# Patient Record
Sex: Male | Born: 1955 | Race: White | Hispanic: No | Marital: Single | Smoking: Never smoker
Health system: Southern US, Community
[De-identification: ages and names within clinical notes are randomized; demographics above are authoritative.]

## PROBLEM LIST (undated history)

## (undated) DIAGNOSIS — C439 Malignant melanoma of skin, unspecified: Secondary | ICD-10-CM

## (undated) HISTORY — DX: Malignant melanoma of skin, unspecified: C43.9

## (undated) HISTORY — PX: KNEE ARTHROSCOPY: SUR90

---

## 2004-10-11 ENCOUNTER — Encounter: Admission: RE | Admit: 2004-10-11 | Discharge: 2004-10-11 | Payer: Self-pay | Admitting: Sports Medicine

## 2005-04-24 ENCOUNTER — Ambulatory Visit (HOSPITAL_COMMUNITY): Admission: RE | Admit: 2005-04-24 | Discharge: 2005-04-24 | Payer: Self-pay | Admitting: Cardiology

## 2019-06-21 ENCOUNTER — Other Ambulatory Visit: Payer: Self-pay

## 2019-06-21 DIAGNOSIS — Z20822 Contact with and (suspected) exposure to covid-19: Secondary | ICD-10-CM

## 2019-06-23 LAB — NOVEL CORONAVIRUS, NAA: SARS-CoV-2, NAA: NOT DETECTED

## 2019-06-25 ENCOUNTER — Other Ambulatory Visit: Payer: Self-pay

## 2019-06-25 ENCOUNTER — Emergency Department (HOSPITAL_COMMUNITY): Payer: BC Managed Care – PPO

## 2019-06-25 ENCOUNTER — Encounter (HOSPITAL_COMMUNITY): Payer: Self-pay

## 2019-06-25 ENCOUNTER — Emergency Department (HOSPITAL_COMMUNITY)
Admission: EM | Admit: 2019-06-25 | Discharge: 2019-06-25 | Disposition: A | Discharge status: Home / Self Care | Payer: BC Managed Care – PPO | Attending: Emergency Medicine | Admitting: Emergency Medicine

## 2019-06-25 DIAGNOSIS — J069 Acute upper respiratory infection, unspecified: Secondary | ICD-10-CM | POA: Insufficient documentation

## 2019-06-25 DIAGNOSIS — R05 Cough: Secondary | ICD-10-CM | POA: Diagnosis present

## 2019-06-25 MED ORDER — DOXYCYCLINE HYCLATE 100 MG PO CAPS: 100.0000 mg | ORAL_CAPSULE | Freq: Two times a day (BID) | ORAL | 0 refills | Status: DC

## 2019-06-25 MED ORDER — BENZONATATE 100 MG PO CAPS: 100.0000 mg | ORAL_CAPSULE | Freq: Three times a day (TID) | ORAL | 0 refills | Status: DC

## 2019-06-25 NOTE — Discharge Instructions (Signed)
Take doxycycline as prescribed.  Take Tessalon as needed for cough.  Rest and drink plenty of fluids.  Take Tylenol or Motrin as needed for pain.  Follow-up with your primary care provider in a week for continued evaluation.  Return to the ER immediately for new or worsening symptoms or concerns, such as chest pain, shortness of breath or any concerns at all.

## 2019-06-25 NOTE — ED Triage Notes (Addendum)
Pt states he has had a dry cough x 1 week. PCP suggested chest x ray for eval. No relief with tessalon pearls.

## 2019-06-25 NOTE — ED Provider Notes (Signed)
[facility] DEPT Provider Note   CSN: [id number] Arrival date & time: 06/25/19  [id number]     History   Chief Complaint Chief Complaint  Patient presents with  . Cough    HPI Shawn Fuller is a 63 y.o. male.     HPI  63 year old male, no significant medical history, presents with a 10-day history of cough.  He states that he has been tested for coronavirus on Tuesday and it was negative.  He notes that he coughs up some mucus.  He denies any fevers, chills, chest pain, shortness of breath.  He denies any loss of taste or loss of smell.  Denies any known sick contacts.  History reviewed. No pertinent past medical history.  There are no active problems to display for this patient.   History reviewed. No pertinent surgical history.      Home Medications    Prior to Admission medications   Not on File    Family History No family history on file.  Social History Social History   Tobacco Use  . Smoking status: Not on file  Substance Use Topics  . Alcohol use: Not on file  . Drug use: Not on file     Allergies   Patient has no known allergies.   Review of Systems Review of Systems  Constitutional: Negative for chills and fever.  HENT: Negative for ear discharge, ear pain, postnasal drip and rhinorrhea.   Respiratory: Positive for cough. Negative for shortness of breath.   Cardiovascular: Negative for chest pain.  Gastrointestinal: Negative for abdominal pain, nausea and vomiting.     Physical Exam Updated Vital Signs BP (!) 154/99   Pulse 83   Temp 98.2 F (36.8 C) (Oral)   Resp 16   Wt 103.9 kg   SpO2 98%   Physical Exam Vitals signs and nursing note reviewed.  Constitutional:      Appearance: He is well-developed.  HENT:     Head: Normocephalic and atraumatic.     Nose: Nose normal.     Mouth/Throat:     Lips: Pink.     Tongue: Tongue does not deviate from midline.     Palate: No lesions.     Pharynx:  Uvula midline. Posterior oropharyngeal erythema present.     Tonsils: No tonsillar exudate or tonsillar abscesses.  Eyes:     Conjunctiva/sclera: Conjunctivae normal.  Neck:     Musculoskeletal: Neck supple.  Cardiovascular:     Rate and Rhythm: Normal rate and regular rhythm.     Heart sounds: Normal heart sounds. No murmur.  Pulmonary:     Effort: Pulmonary effort is normal. No respiratory distress.     Breath sounds: Normal breath sounds. No wheezing or rales.  Abdominal:     General: Bowel sounds are normal. There is no distension.     Palpations: Abdomen is soft.     Tenderness: There is no abdominal tenderness.  Musculoskeletal: Normal range of motion.        General: No tenderness or deformity.  Skin:    General: Skin is warm and dry.     Findings: No erythema or rash.  Neurological:     Mental Status: He is alert and oriented to person, place, and time.  Psychiatric:        Behavior: Behavior normal.      ED Treatments / Results  Labs (all labs ordered are listed, but only abnormal results are displayed) Labs Reviewed - No data  to display  EKG None  Radiology Dg Chest 2 View  Result Date: 06/25/2019 CLINICAL DATA:  Dry cough for a week. EXAM: CHEST - 2 VIEW COMPARISON:  None. FINDINGS: The heart size and mediastinal contours are within normal limits. Both lungs are clear. The visualized skeletal structures are unremarkable. IMPRESSION: No active cardiopulmonary disease.  No evidence of pneumonia. Electronically Signed   By: Franki Cabot M.D.   On: 06/25/2019 09:57    Procedures Procedures (including critical care time)  Medications Ordered in ED Medications - No data to display   Initial Impression / Assessment and Plan / ED Course  I have reviewed the triage vital signs and the nursing notes.  Pertinent labs & imaging results that were available during my care of the patient were reviewed by me and considered in my medical decision making (see chart for  details).        Patient presents with a cough for 10 days.  His physical exam shows some mildly erythematous posterior pharynx.  His lungs are clear to auscultation throughout.  He has no increased work of breathing or accessory muscle use.  He had a chest x-ray done by triage which shows no abnormalities, no pneumonia, pneumothorax, pleural effusion.  He was coronavirus tested on Tuesday and this was negative.  He is well-appearing however given his symptom duration, will trial antibiotics.  We will also write for cough medicine.  Patient is agreeable with this plan.  He was given strict return precautions and expressed understanding.  He is ready and stable for discharge.   At this time there does not appear to be any evidence of an acute emergency medical condition and the patient appears stable for discharge with appropriate outpatient follow up.Diagnosis was discussed with patient who verbalizes understanding and is agreeable to discharge.   Final Clinical Impressions(s) / ED Diagnoses   Final diagnoses:  None    ED Discharge Orders    None       Etter Sjogren, PA-C 06/25/19 [place], [place], DO 06/25/19 1851

## 2019-08-18 ENCOUNTER — Other Ambulatory Visit: Payer: Self-pay

## 2019-08-18 ENCOUNTER — Ambulatory Visit: Payer: BC Managed Care – PPO | Admitting: Pulmonary Disease

## 2019-08-18 ENCOUNTER — Encounter: Payer: Self-pay | Admitting: Pulmonary Disease

## 2019-08-18 VITALS — BP 140/88 | HR 70 | Temp 97.3°F | Ht 72.0 in | Wt 226.2 lb

## 2019-08-18 DIAGNOSIS — R058 Other specified cough: Secondary | ICD-10-CM

## 2019-08-18 DIAGNOSIS — R05 Cough: Secondary | ICD-10-CM | POA: Diagnosis not present

## 2019-08-18 DIAGNOSIS — R059 Cough, unspecified: Secondary | ICD-10-CM

## 2019-08-18 NOTE — Patient Instructions (Addendum)
Thank you for visiting Dr. Valeta Harms at [facility]. Today we recommend the following:  Trial of steroid inhaler. Twice daily.   Return if symptoms worsen or fail to improve.    Please do your part to reduce the spread of COVID-19.

## 2019-08-18 NOTE — Progress Notes (Signed)
Synopsis: Referred in November 2020 for chronic cough by Gaynelle Arabian, MD  Subjective:   PATIENT ID: Shawn Fuller GENDER: male DOB: 1955/10/07, MRN: [id number]  Chief Complaint  Patient presents with  . Pulmonary Consult    cough since september    NO significant medical history. 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 He was living in an apartment in as they were renovating his home. Not involved in the renovation. Just walk through to check on stuff. No dust exposure. No animals in the home. Cough start spontaneously in September. The cough is worse going to bed and laying down. When the cough first started he had to sleep in the other room.  He cannot think of any events that occurred in September that may have caused the cough.  Does not have any hobbies that are exposures to chemicals or dust.  He has a house cleaner and a line crew that helps with the outside.  He goes to a Physiological scientist 3 times a week at his home.  He did have Covid testing that was negative.  He lives with his wife in [facility].  He was put on steroids and antibiotics which did not improve his cough.  He was also given a PPI which also did not improve his cough.  However he has only been that for 7 to 10 days.  Patient denies fevers chills night sweats weight loss.   Past Medical History:  Diagnosis Date  . Melanoma ([place])      Family History  Problem Relation Age of Onset  . Lung cancer Mother   . Heart disease Father      Past Surgical History:  Procedure Laterality Date  . KNEE ARTHROSCOPY      Social History   Socioeconomic History  . Marital status: Single    Spouse name: Not on file  . Number of children: Not on file  . Years of education: Not on file  . Highest education level: Not on file  Occupational History  . Not on file  Social Needs  . Financial resource strain: Not on file  . Food insecurity    Worry: Not on file    Inability: Not on file  . Transportation needs    Medical: Not on file    Non-medical: Not on file  Tobacco Use  . Smoking status: Never Smoker  . Smokeless tobacco: Never Used  Substance and Sexual Activity  . Alcohol use: Not on file  . Drug use: Not on file  . Sexual activity: Not on file  Lifestyle  . Physical activity    Days per week: Not on file    Minutes per session: Not on file  . Stress: Not on file  Relationships  . Social Herbalist on phone: Not on file    Gets together: Not on file    Attends religious service: Not on file    Active member of club or organization: Not on file    Attends meetings of clubs or organizations: Not on file    Relationship status: Not on file  . Intimate partner violence    Fear of current or ex partner: Not on file    Emotionally abused: Not on file    Physically abused: Not on file    Forced sexual activity: Not on file  Other Topics Concern  . Not on file  Social History Narrative  . Not on file     No  Known Allergies   Outpatient Medications Prior to Visit  Medication Sig Dispense Refill  . benzonatate (TESSALON) 100 MG capsule Take 1 capsule (100 mg total) by mouth every 8 (eight) hours. (Patient not taking: Reported on 08/18/2019) 21 capsule 0  . doxycycline (VIBRAMYCIN) 100 MG capsule Take 1 capsule (100 mg total) by mouth 2 (two) times daily. (Patient not taking: Reported on 08/18/2019) 14 capsule 0   No facility-administered medications prior to visit.     Review of Systems  Constitutional: Negative for chills, fever, malaise/fatigue and weight loss.  HENT: Negative for hearing loss, sore throat and tinnitus.   Eyes: Negative for blurred vision and double vision.  Respiratory: Positive for cough. Negative for hemoptysis, sputum production, shortness of breath, wheezing and stridor.   Cardiovascular: Negative for chest pain, palpitations, orthopnea, leg swelling and PND.  Gastrointestinal: Negative for abdominal pain, constipation, diarrhea, heartburn,  nausea and vomiting.  Genitourinary: Negative for dysuria, hematuria and urgency.  Musculoskeletal: Negative for joint pain and myalgias.  Skin: Negative for itching and rash.  Neurological: Negative for dizziness, tingling, weakness and headaches.  Endo/Heme/Allergies: Negative for environmental allergies. Does not bruise/bleed easily.  Psychiatric/Behavioral: Negative for depression. The patient is not nervous/anxious and does not have insomnia.   All other systems reviewed and are negative.    Objective:  Physical Exam Vitals signs reviewed.  Constitutional:      General: He is not in acute distress.    Appearance: He is well-developed.  HENT:     Head: Normocephalic and atraumatic.  Eyes:     General: No scleral icterus.    Conjunctiva/sclera: Conjunctivae normal.     Pupils: Pupils are equal, round, and reactive to light.  Neck:     Musculoskeletal: Neck supple.     Vascular: No JVD.     Trachea: No tracheal deviation.  Cardiovascular:     Rate and Rhythm: Normal rate and regular rhythm.     Heart sounds: Normal heart sounds. No murmur.  Pulmonary:     Effort: Pulmonary effort is normal. No tachypnea, accessory muscle usage or respiratory distress.     Breath sounds: Normal breath sounds. No stridor. No wheezing, rhonchi or rales.  Abdominal:     General: Bowel sounds are normal. There is no distension.     Palpations: Abdomen is soft.     Tenderness: There is no abdominal tenderness.  Musculoskeletal:        General: No tenderness.  Lymphadenopathy:     Cervical: No cervical adenopathy.  Skin:    General: Skin is warm and dry.     Capillary Refill: Capillary refill takes less than 2 seconds.     Findings: No rash.  Neurological:     Mental Status: He is alert and oriented to person, place, and time.  Psychiatric:        Behavior: Behavior normal.      Vitals:   08/18/19 0923  BP: 140/88  Pulse: 70  Temp: (!) 97.3 F (36.3 C)  TempSrc: Oral  SpO2: 98%   Weight: 226 lb 3.2 oz (102.6 kg)  Height: 6' (1.829 m)   98% on RA BMI Readings from Last 3 Encounters:  08/18/19 30.68 kg/m   Wt Readings from Last 3 Encounters:  08/18/19 226 lb 3.2 oz (102.6 kg)  06/25/19 229 lb (103.9 kg)     CBC No results found for: WBC, RBC, HGB, HCT, PLT, MCV, MCH, MCHC, RDW, LYMPHSABS, MONOABS, EOSABS, BASOSABS  06/21/2019: COVID-19 negative  Chest Imaging: 06/25/2019: Chest x-ray No active cardiopulmonary disease no infiltrate. The patient's images have been independently reviewed by me.    Pulmonary Functions Testing Results: No flowsheet data found.  FeNO: None   Pathology: None   Echocardiogram: None   Heart Catheterization: None     Assessment & Plan:     ICD-10-CM   1. Cough  R05   2. Non-productive cough  R05     Discussion:  This is a 63 year old gentleman with cough present since the end of September.  It is approximately 7 weeks since the initial start date.  Denies hemoptysis.  No clear etiology of his cough.  I suspect that he had some type of URI/viral bronchial epithelial injury that has been slow to recover with post viral cough syndrome.  He has no symptoms consistent with upper airway cough syndrome.  He has used PPI with no improvement and also does not experience any reflux type symptoms.  Plan: His cough is slowly improving at this time.  He did keep this appointment just because the cough was so bad a couple weeks ago. We will give the patient a steroid inhaler, Qvar 40 to be used 1 puff twice daily. We will see if this helps some of the inflammation and symptoms over the next couple of days to weeks. I suspect his cough will dissipate over the next 2 weeks or so. We will give him samples of Delsym cough suppressant as well. This could very well be the start of recurrent symptoms potentially related to allergies but he has not had this trouble in the past.  I think the best thing to do is to treat him conservatively and  watch his trajectory of improvement that he has had over the past week.  Patient return to clinic as needed.  Greater than 50% of this patient's 45-minute office visit was been face-to-face discussing above recommendations and treatment plan.   Current Outpatient Medications:  .  benzonatate (TESSALON) 100 MG capsule, Take 1 capsule (100 mg total) by mouth every 8 (eight) hours. (Patient not taking: Reported on 08/18/2019), Disp: 21 capsule, Rfl: 0 .  doxycycline (VIBRAMYCIN) 100 MG capsule, Take 1 capsule (100 mg total) by mouth 2 (two) times daily. (Patient not taking: Reported on 08/18/2019), Disp: 14 capsule, Rfl: 0   Garner Nash, DO [facility] 08/18/2019 9:50 AM

## 2020-01-09 DIAGNOSIS — C44111 Basal cell carcinoma of skin of unspecified eyelid, including canthus: Secondary | ICD-10-CM | POA: Insufficient documentation

## 2021-01-21 ENCOUNTER — Ambulatory Visit: Payer: BC Managed Care – PPO | Admitting: Family Medicine

## 2021-01-29 ENCOUNTER — Encounter: Payer: Self-pay | Admitting: Family Medicine

## 2021-01-29 ENCOUNTER — Other Ambulatory Visit: Payer: Self-pay

## 2021-01-29 ENCOUNTER — Ambulatory Visit: Payer: BC Managed Care – PPO | Admitting: Family Medicine

## 2021-01-29 VITALS — BP 142/64 | HR 77 | Ht 71.0 in | Wt 202.8 lb

## 2021-01-29 DIAGNOSIS — G9332 Myalgic encephalomyelitis/chronic fatigue syndrome: Secondary | ICD-10-CM | POA: Insufficient documentation

## 2021-01-29 DIAGNOSIS — R5382 Chronic fatigue, unspecified: Secondary | ICD-10-CM | POA: Insufficient documentation

## 2021-01-29 DIAGNOSIS — Z860100 Personal history of colon polyps, unspecified: Secondary | ICD-10-CM

## 2021-01-29 DIAGNOSIS — E559 Vitamin D deficiency, unspecified: Secondary | ICD-10-CM | POA: Insufficient documentation

## 2021-01-29 DIAGNOSIS — Z7689 Persons encountering health services in other specified circumstances: Secondary | ICD-10-CM | POA: Diagnosis not present

## 2021-01-29 DIAGNOSIS — E291 Testicular hypofunction: Secondary | ICD-10-CM | POA: Insufficient documentation

## 2021-01-29 DIAGNOSIS — C439 Malignant melanoma of skin, unspecified: Secondary | ICD-10-CM | POA: Diagnosis not present

## 2021-01-29 DIAGNOSIS — E663 Overweight: Secondary | ICD-10-CM | POA: Insufficient documentation

## 2021-01-29 NOTE — Progress Notes (Signed)
Office Visit Note   Patient: Shawn Fuller           Date of Birth: Feb 09, 1956           MRN: [id number] Visit Date: 01/29/2021 Requested by: Gaynelle Arabian, MD [address] [place],  [place] [address] PCP: Eunice Blase, MD  Subjective: Chief Complaint  Patient presents with  . Other    Establish primary care    HPI: He is here to establish care.  He was referred by Orinda Kenner.  Today he is doing well, no specific complaints.  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  He is monitored every 6 months by Dr. Elvera Lennox.  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  So far it seems to be helping a lot with his fatigue.  He exercises regularly, eats healthfully.  He has a history of vitamin D deficiency which has been well controlled with supplements.  He had colon polyps 2 years ago and is due for another colonoscopy in 3 years.  He is up-to-date on eye exams and dental exams.               ROS:   All other systems were reviewed and are negative.  Objective: Vital Signs: BP (!) 142/64   Pulse 77   Ht 5\' 11"  (1.803 m)   Wt 202 lb 12.8 oz (92 kg)   BMI 28.28 kg/m   Physical Exam:  General:  Alert and oriented, in no acute distress. Pulm:  Breathing unlabored. Psy:  Normal mood, congruent affect. Skin: No suspicious lesions today. Neck: No carotid bruits.  No lymphadenopathy, no thyromegaly. CV: Regular rate and rhythm without murmurs, rubs, or gallops.  No peripheral edema.  2+ radial and posterior tibial pulses. Lungs: Clear to auscultation throughout with no wheezing or areas of consolidation. Abd: Bowel sounds are active, no hepatosplenomegaly or masses.  Soft and nontender.  No audible bruits.  No evidence of ascites.    Imaging: No results found.  Assessment & Plan: 1.  Visit to establish  care -He will be due for a wellness exam in December so he will come back for that when the time comes. -He will contact me sooner than that if any issues occur.  2.  History of multiple skin cancers, monitored by dermatology.  3.  Testosterone deficiency, doing well with pellets.  4.  History of vitamin D deficiency  5.  History of colon polyps - Colonoscopy in 2025.     Procedures: No procedures performed        PMFS History: Patient Active Problem List   Diagnosis Date Noted  . 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 Chronic fatigue syndrome 01/29/2021  . Overweight with body mass index (BMI) 25.0-29.9 01/29/2021  . Melanoma ([place]) 01/29/2021  . 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 Melanoma ([place])     Family History  Problem Relation Age of Onset  . Lung cancer Mother   . Cancer Mother   . Heart disease Father   . Diabetes Father   . Prostate cancer Neg Hx   . Colon cancer Neg Hx   . Skin cancer Neg Hx     Past Surgical History:  Procedure Laterality Date  . KNEE ARTHROSCOPY     Social  History   Occupational History  . Not on file  Tobacco Use  . Smoking status: Never Smoker  . Smokeless tobacco: Never Used  Substance and Sexual Activity  . Alcohol use: Not on file  . Drug use: Not on file  . Sexual activity: Not on file

## 2021-03-21 IMAGING — CR DG CHEST 2V
2 series · 2 of 2 positions shown · non-contrast
Comparison: None.

CLINICAL DATA: Dry cough for a week.

EXAM:
CHEST - 2 VIEW

[w chest pa]
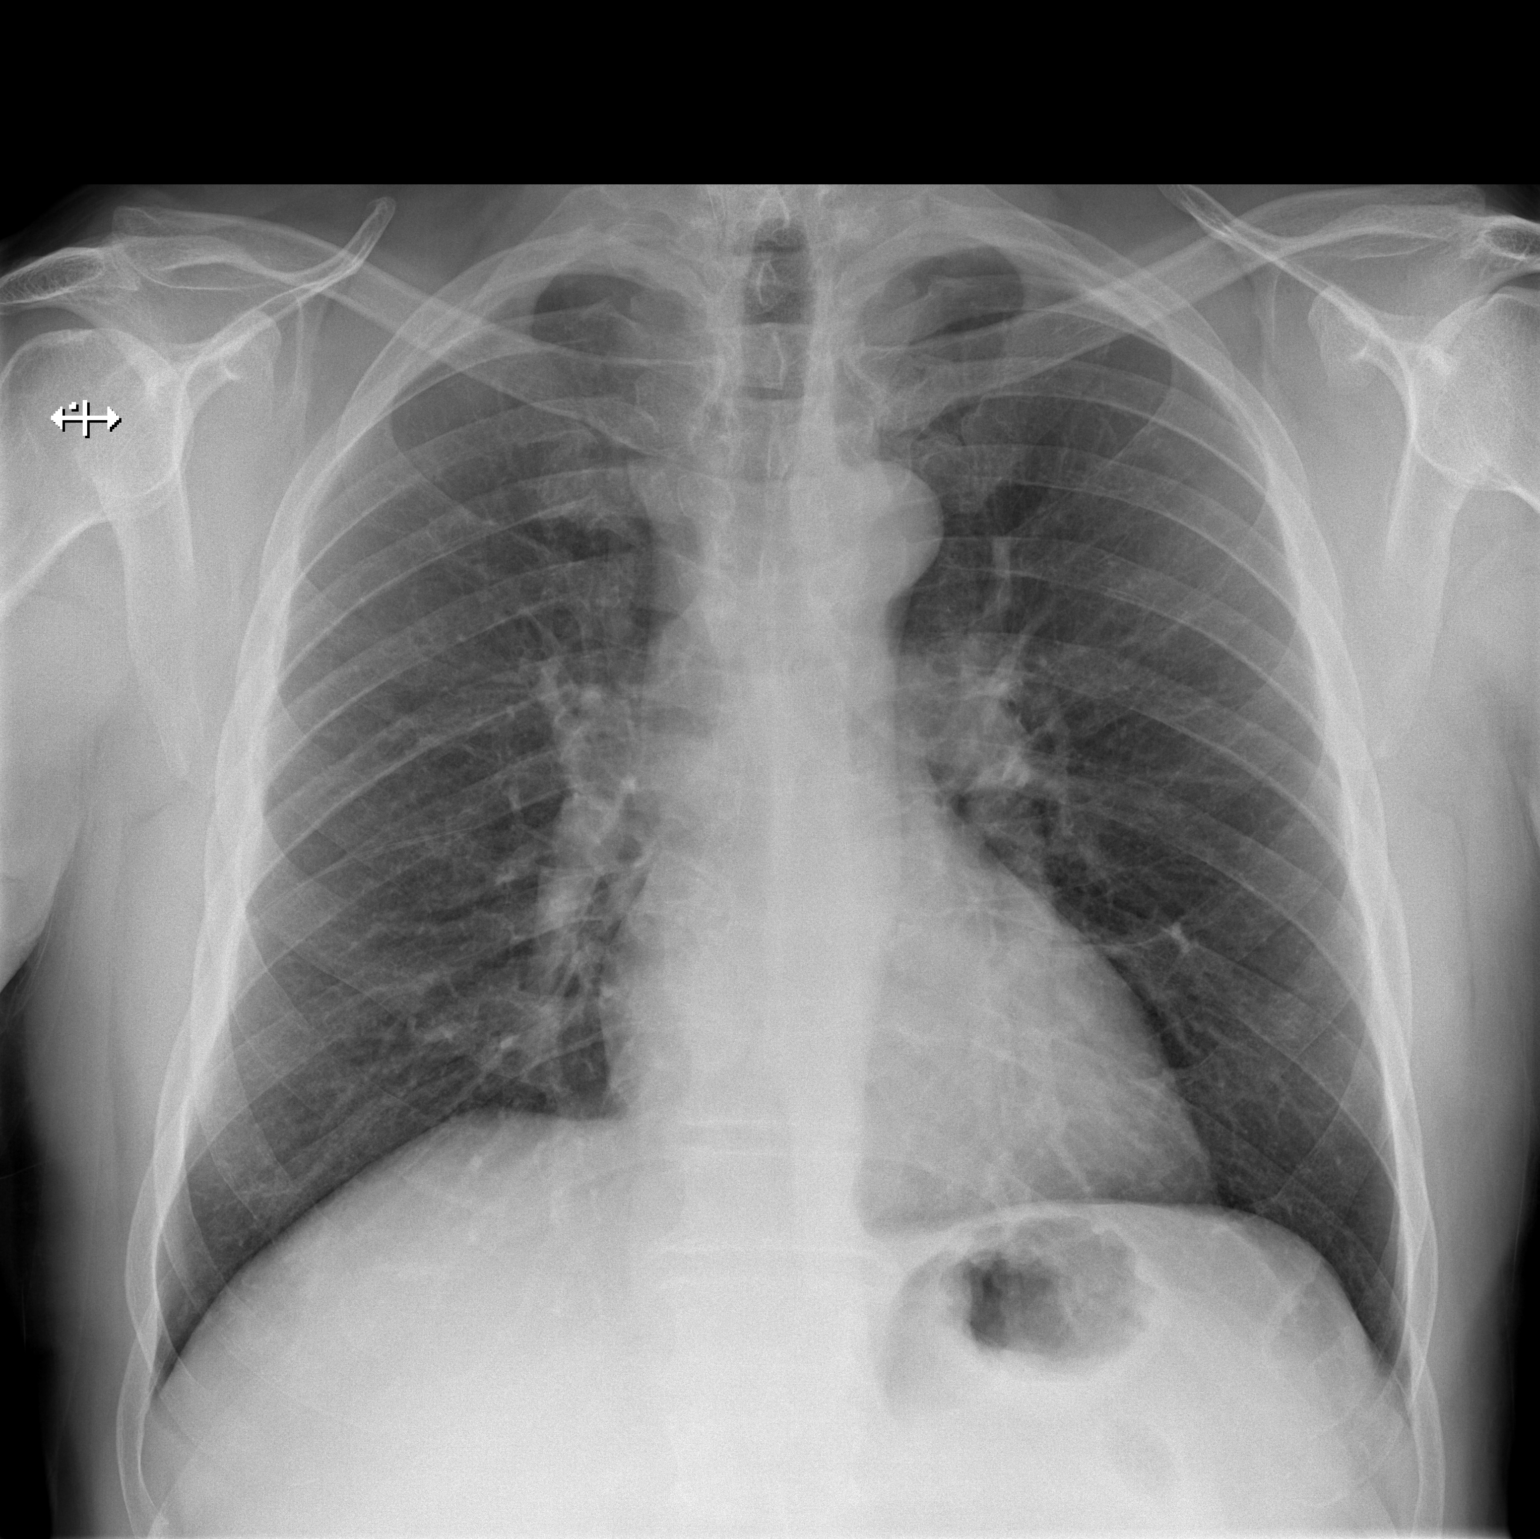

[w chest lat]
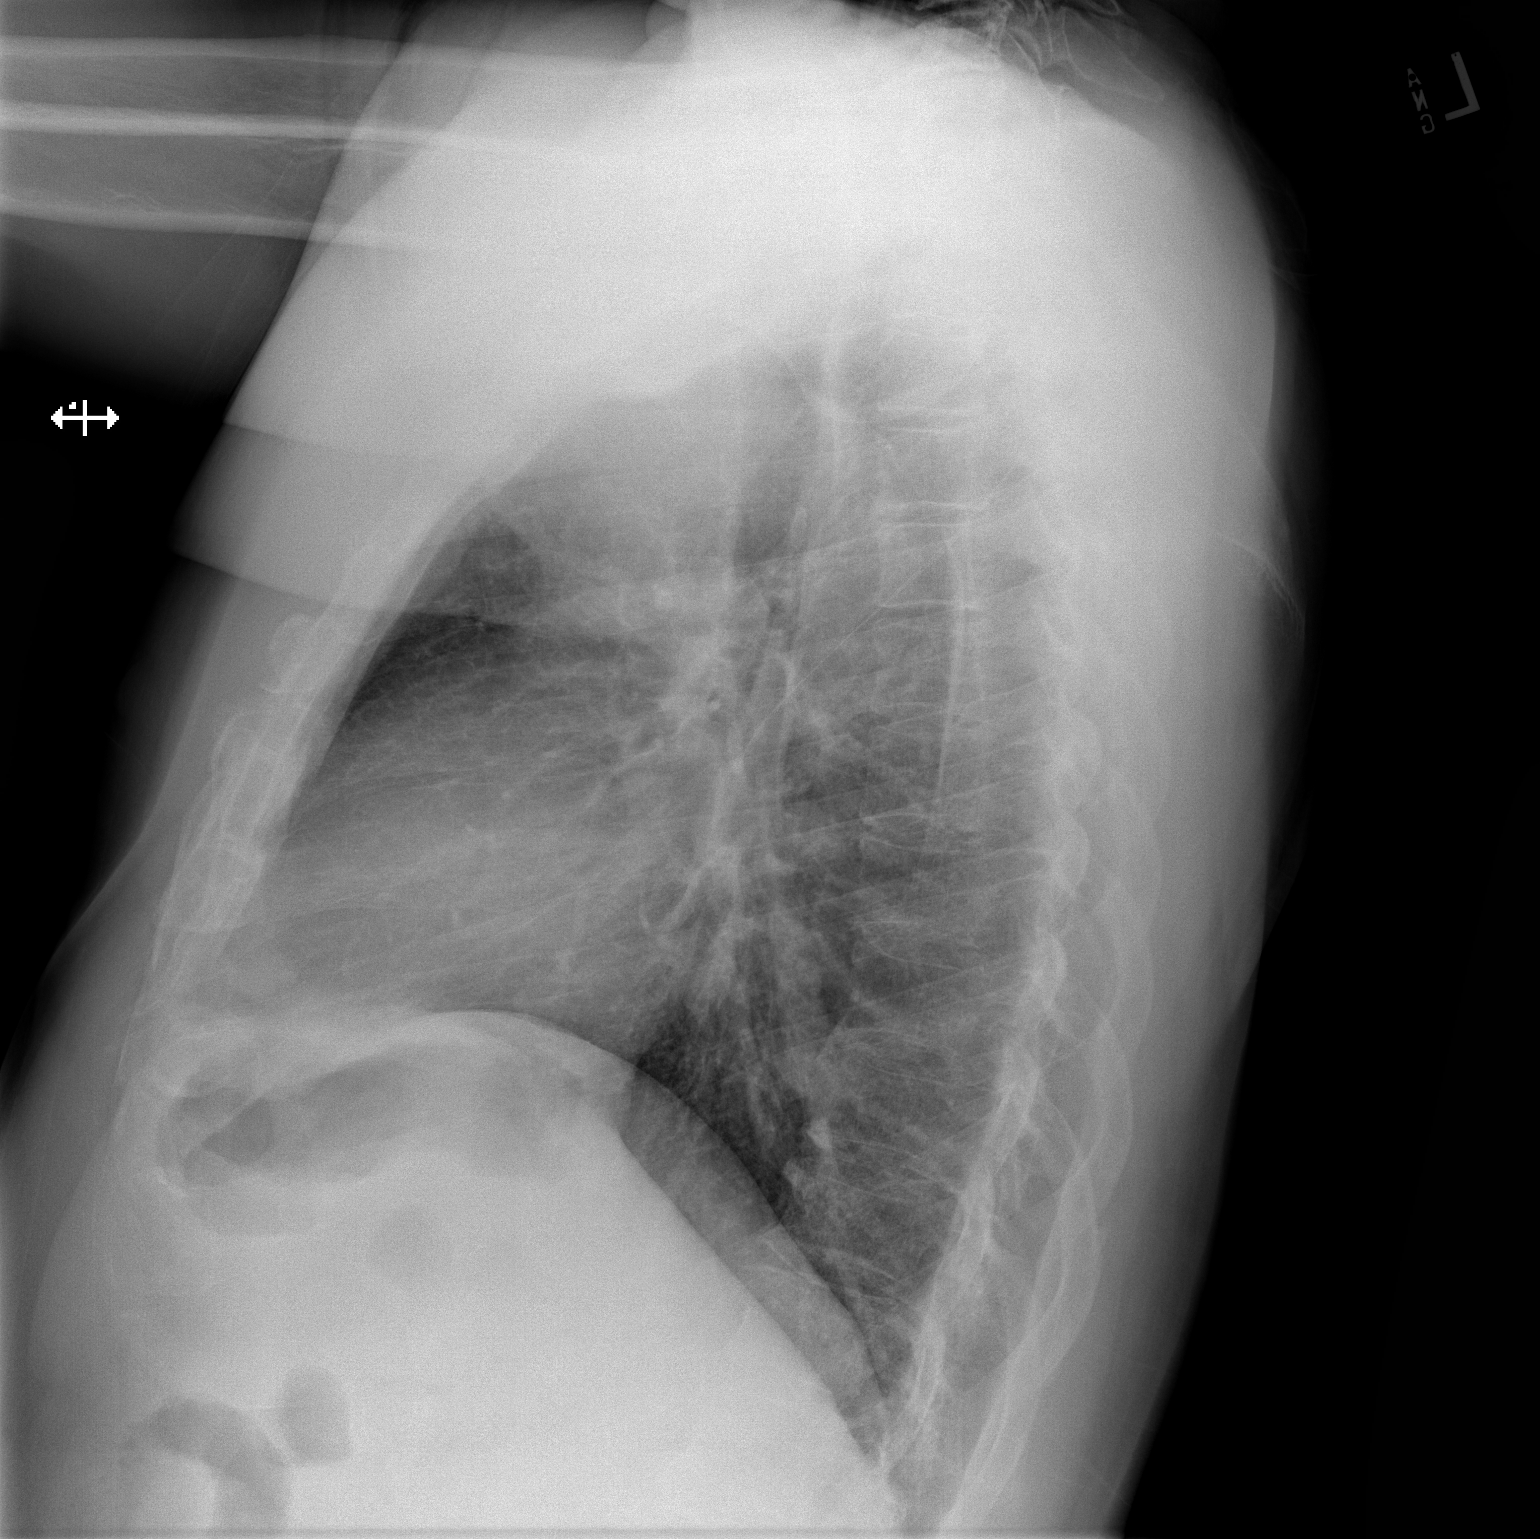

[2 of 2 positions shown; findings below may reference images not displayed]

FINDINGS: The heart size and mediastinal contours are within normal limits.
Both lungs are clear. The visualized skeletal structures are
unremarkable.
IMPRESSION: No active cardiopulmonary disease.  No evidence of pneumonia.

## 2021-05-20 ENCOUNTER — Telehealth: Payer: Self-pay | Admitting: Family Medicine

## 2021-05-20 NOTE — Telephone Encounter (Signed)
Pt called about scheduling annual physical and I let pt know hilts will no longer be with [place] after next week. Can Hilts call him?   CB [phone]

## 2021-05-20 NOTE — Telephone Encounter (Signed)
You have no first morning/first afternoon appointments left. Ok to schedule wherever?

## 2021-05-20 NOTE — Telephone Encounter (Signed)
He is signed up for MyChart.

## 2021-05-21 NOTE — Telephone Encounter (Signed)
Left message for the patient to call me back to schedule a physical.

## 2021-05-22 NOTE — Telephone Encounter (Signed)
Sent the patient a MyChart message, asking if he would still like to schedule a physical with Dr. Junius Roads. Awaiting phone or message response from patient with how to proceed.

## 2022-08-26 ENCOUNTER — Other Ambulatory Visit: Payer: Self-pay | Admitting: Family Medicine

## 2022-08-26 DIAGNOSIS — E785 Hyperlipidemia, unspecified: Secondary | ICD-10-CM

## 2022-09-25 ENCOUNTER — Ambulatory Visit
Admission: RE | Admit: 2022-09-25 | Discharge: 2022-09-25 | Disposition: A | Discharge status: Home / Self Care | Payer: No Typology Code available for payment source | Source: Ambulatory Visit | Attending: Family Medicine | Admitting: Family Medicine

## 2022-09-25 DIAGNOSIS — E785 Hyperlipidemia, unspecified: Secondary | ICD-10-CM

## 2022-11-05 ENCOUNTER — Ambulatory Visit: Payer: BC Managed Care – PPO | Admitting: Cardiovascular Disease

## 2022-11-18 ENCOUNTER — Encounter: Payer: Self-pay | Admitting: Cardiovascular Disease

## 2022-11-18 ENCOUNTER — Ambulatory Visit: Payer: BC Managed Care – PPO | Attending: Cardiovascular Disease | Admitting: Cardiovascular Disease

## 2022-11-18 VITALS — BP 116/70 | HR 67 | Ht 72.0 in | Wt 204.0 lb

## 2022-11-18 DIAGNOSIS — R931 Abnormal findings on diagnostic imaging of heart and coronary circulation: Secondary | ICD-10-CM | POA: Diagnosis not present

## 2022-11-18 NOTE — Progress Notes (Signed)
11/18/2022 Angelis Tomich Moose   31-May-1956  [id number]  Primary Physician Hilts, Legrand Como, MD Primary Cardiologist: Lorretta Harp MD Lupe Carney, [place]  HPI:  Shawn Fuller is a 67 y.o. fit appearing married Caucasian male with no children referred by Dr. Junius Roads, his PCP, because of elevated coronary calcium score.  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  He he has no cardiac risk factors other than family history father who died of a myocardial infarction at age 18.  He is never had a heart attack or stroke.  He denies chest pain or shortness of breath.  He exercises 7 days a week doing an hour of cardio and weights.  Apparently he had a cardiac catheterization over 20 years ago by Dr. Fransico Him which was clean.  His coronary calcium score performed 09/25/2022 was 1458 with calcium distributed in all 3 coronary arteries.   No outpatient medications have been marked as taking for the 11/18/22 encounter (Office Visit) with Lorretta Harp, MD.     No Known Allergies  Social History   Socioeconomic History   Marital status: Single    Spouse name: Not on file   Number of children: Not on file   Years of education: Not on file   Highest education level: Not on file  Occupational History   Not on file  Tobacco Use   Smoking status: Never   Smokeless tobacco: Never  Substance and Sexual Activity   Alcohol use: Not on file   Drug use: Not on file   Sexual activity: Not on file  Other Topics Concern   Not on file  Social History Narrative   Not on file   Social Determinants of Health   Financial Resource Strain: Not on file  Food Insecurity: Not on file  Transportation Needs: Not on file  Physical Activity: Not on file  Stress: Not on file  Social Connections: Not on file  Intimate Partner Violence: Not on file     Review of Systems: General: negative for chills, fever, night sweats or  weight changes.  Cardiovascular: negative for chest pain, dyspnea on exertion, edema, orthopnea, palpitations, paroxysmal nocturnal dyspnea or shortness of breath Dermatological: negative for rash Respiratory: negative for cough or wheezing Urologic: negative for hematuria Abdominal: negative for nausea, vomiting, diarrhea, bright red blood per rectum, melena, or hematemesis Neurologic: negative for visual changes, syncope, or dizziness All other systems reviewed and are otherwise negative except as noted above.    Blood pressure 116/70, pulse 67, height 6' (1.829 m), weight 204 lb (92.5 kg).  General appearance: alert and no distress Neck: no adenopathy, no carotid bruit, no JVD, supple, symmetrical, trachea midline, and thyroid not enlarged, symmetric, no tenderness/mass/nodules Lungs: clear to auscultation bilaterally Heart: regular rate and rhythm, S1, S2 normal, no murmur, click, rub or gallop Extremities: extremities normal, atraumatic, no cyanosis or edema Pulses: 2+ and symmetric Skin: Skin color, texture, turgor normal. No rashes or lesions Neurologic: Grossly normal  EKG sinus rhythm at 67 with occasional PVC.  I personally reviewed this EKG.  ASSESSMENT AND PLAN:   Elevated coronary artery calcium score [place] is referred to me by Dr. Junius Roads because of an elevated coronary calcium score of 1458 distributed through all 3 coronary arteries performed 09/25/2022.  His current cardiac risk factor profile is notable for family history with a father who died of a myocardial infarction at age 72.  He has no other risk factors.  He exercises vigorously 7 days a week without symptoms.  I am going to get a 2D echo and a cardiac PET perfusion study to rule out obstructive disease.     Lorretta Harp MD FACP,FACC,FAHA, [facility] 11/18/2022 3:31 PM   Addendum: His most recent lipid profile performed 08/26/2022 revealed an LDL particle number of 1180 with a total LDL of 104.  Given  his elevated coronary calcium score I would like his LDL to be less than 70.  Based on this, I am going to begin him on atorvastatin 20 mg a day and we will recheck a fasting lipid liver profile in 3 months.  Lorretta Harp, M.D., [place], [facility], [place]ver[place] [address] [place], [place]  [address]  [phone] 11/20/2022 4:11 PM

## 2022-11-18 NOTE — Patient Instructions (Signed)
Medication Instructions:  Your physician recommends that you continue on your current medications as directed. Please refer to the Current Medication list given to you today.  *If you need a refill on your cardiac medications before your next appointment, please call your pharmacy*   Testing/Procedures: Your physician has requested that you have an echocardiogram. Echocardiography is a painless test that uses sound waves to create images of your heart. It provides your doctor with information about the size and shape of your heart and how well your heart's chambers and valves are working. This procedure takes approximately one hour. There are no restrictions for this procedure. Please do NOT wear cologne, perfume, aftershave, or lotions (deodorant is allowed). Please arrive 15 minutes prior to your appointment time.  This procedure will be done at [address]    Follow-Up: At [facility], you and your health needs are our priority.  As part of our continuing mission to provide you with exceptional heart care, we have created designated Provider Care Teams.  These Care Teams include your primary Cardiologist (physician) and Advanced Practice Providers (APPs -  Physician Assistants and Nurse Practitioners) who all work together to provide you with the care you need, when you need it.  We recommend signing up for the patient portal called "MyChart".  Sign up information is provided on this After Visit Summary.  MyChart is used to connect with patients for Virtual Visits (Telemedicine).  Patients are able to view lab/test results, encounter notes, upcoming appointments, etc.  Non-urgent messages can be sent to your provider as well.   To learn more about what you can do with MyChart, go to [email or web address].    Your next appointment:   We will see you on an as needed basis.   Provider:   Quay Burow, MD    Other Instructions How to Prepare for Your Cardiac  PET/CT Stress Test:  1. Please do not take these medications before your test:   Medications that may interfere with the cardiac pharmacological stress agent (ex. nitrates - including erectile dysfunction medications, isosorbide mononitrate, tamulosin or beta-blockers) the day of the exam. (Erectile dysfunction medication should be held for at least 72 hrs prior to test) Theophylline containing medications for 12 hours. Dipyridamole 48 hours prior to the test. Your remaining medications may be taken with water.  2. Nothing to eat or drink, except water, 3 hours prior to arrival time.   NO caffeine/decaffeinated products, or chocolate 12 hours prior to arrival.  3. NO perfume, cologne or lotion  4. Total time is 1 to 2 hours; you may want to bring reading material for the waiting time.  5. Please report to Radiology at the [facility] Main Entrance 30 minutes early for your test.  [place], [place] [address]  Diabetic Preparation:  Hold oral medications. You may take NPH and Lantus insulin. Do not take Humalog or Humulin R (Regular Insulin) the day of your test. Check blood sugars prior to leaving the house. If able to eat breakfast prior to 3 hour fasting, you may take all medications, including your insulin, Do not worry if you miss your breakfast dose of insulin - start at your next meal.  IF YOU THINK YOU MAY BE PREGNANT, OR ARE NURSING PLEASE INFORM THE TECHNOLOGIST.  In preparation for your appointment, medication and supplies will be purchased.  Appointment availability is limited, so if you need to cancel or reschedule, please call the  Radiology Department at [phone]  24 hours in advance to avoid a cancellation fee of $100.00  What to Expect After you Arrive:  Once you arrive and check in for your appointment, you will be taken to a preparation room within the Radiology Department.  A technologist or Nurse will obtain your medical history,  verify that you are correctly prepped for the exam, and explain the procedure.  Afterwards,  an IV will be started in your arm and electrodes will be placed on your skin for EKG monitoring during the stress portion of the exam. Then you will be escorted to the PET/CT scanner.  There, staff will get you positioned on the scanner and obtain a blood pressure and EKG.  During the exam, you will continue to be connected to the EKG and blood pressure machines.  A small, safe amount of a radioactive tracer will be injected in your IV to obtain a series of pictures of your heart along with an injection of a stress agent.    After your Exam:  It is recommended that you eat a meal and drink a caffeinated beverage to counter act any effects of the stress agent.  Drink plenty of fluids for the remainder of the day and urinate frequently for the first couple of hours after the exam.  Your doctor will inform you of your test results within 7-10 business days.  For questions about your test or how to prepare for your test, please call: Marchia Bond, Cardiac Imaging Nurse Navigator  Gordy Clement, Cardiac Imaging Nurse Navigator Office: [phone]

## 2022-11-18 NOTE — Assessment & Plan Note (Signed)
[place] is referred to me by Dr. Junius Roads because of an elevated coronary calcium score of 1458 distributed through all 3 coronary arteries performed 09/25/2022.  His current cardiac risk factor profile is notable for family history with a father who died of a myocardial infarction at age 65.  He has no other risk factors.  He exercises vigorously 7 days a week without symptoms.  I am going to get a 2D echo and a cardiac PET perfusion study to rule out obstructive disease.

## 2022-11-21 ENCOUNTER — Telehealth: Payer: Self-pay

## 2022-11-21 DIAGNOSIS — R931 Abnormal findings on diagnostic imaging of heart and coronary circulation: Secondary | ICD-10-CM

## 2022-11-21 MED ORDER — ATORVASTATIN CALCIUM 20 MG PO TABS: 20.0000 mg | ORAL_TABLET | Freq: Every day | ORAL | 3 refills | Status: AC

## 2022-11-21 NOTE — Telephone Encounter (Signed)
Called pt to discuss labs that were recently sent to Dr. Gwenlyn Found.   Per Dr. Gwenlyn Found,   Addendum: His most recent lipid profile performed 08/26/2022 revealed an LDL particle number of 1180 with a total LDL of 104.  Given his elevated coronary calcium score I would like his LDL to be less than 70.  Based on this, I am going to begin him on atorvastatin 20 mg a day and we will recheck a fasting lipid liver profile in 3 months.   Lorretta Harp, M.D., [place], [facility], [place] [employer] [address] [place],   [address]          [phone] 11/20/2022 4:11 PM   Discussed Dr. Kennon Holter recommendations with pt. He is slightly reluctant to start medication but does feel that it is necessary to do. Prescription sent to pt's pharmacy of choice. Lab orders placed and mailed to pt's home address. Pt will obtain labs in 3 months. Pt verbalizes understanding.

## 2022-12-03 ENCOUNTER — Other Ambulatory Visit (HOSPITAL_COMMUNITY): Payer: BC Managed Care – PPO

## 2022-12-22 ENCOUNTER — Ambulatory Visit (HOSPITAL_COMMUNITY): Payer: BC Managed Care – PPO | Attending: Internal Medicine

## 2022-12-22 DIAGNOSIS — R931 Abnormal findings on diagnostic imaging of heart and coronary circulation: Secondary | ICD-10-CM | POA: Insufficient documentation

## 2022-12-22 LAB — ECHOCARDIOGRAM COMPLETE
Area-P 1/2: 3.43 cm2
S' Lateral: 3.5 cm

## 2022-12-30 ENCOUNTER — Telehealth (HOSPITAL_COMMUNITY): Payer: Self-pay | Admitting: Emergency Medicine

## 2022-12-30 NOTE — Telephone Encounter (Signed)
Attempted to call patient regarding upcoming cardiac PET appointment. Left message on voicemail with name and callback number [name] RN Navigator Cardiac Imaging  Heart and Vascular Services [phone] Office [phone] Cell  

## 2022-12-31 ENCOUNTER — Encounter (HOSPITAL_COMMUNITY)
Admission: RE | Admit: 2022-12-31 | Discharge: 2022-12-31 | Disposition: A | Discharge status: Home / Self Care | Payer: BC Managed Care – PPO | Source: Ambulatory Visit | Attending: Cardiovascular Disease | Admitting: Cardiovascular Disease

## 2022-12-31 DIAGNOSIS — I2510 Atherosclerotic heart disease of native coronary artery without angina pectoris: Secondary | ICD-10-CM | POA: Diagnosis not present

## 2022-12-31 DIAGNOSIS — R931 Abnormal findings on diagnostic imaging of heart and coronary circulation: Secondary | ICD-10-CM | POA: Insufficient documentation

## 2022-12-31 LAB — NM PET CT CARDIAC PERFUSION MULTI W/ABSOLUTE BLOODFLOW
MBFR: 2.97
Rest MBF: 0.69 ml/g/min
Rest Nuclear Isotope Dose: 24 mCi
ST Depression (mm): 0 mm
Stress MBF: 2.05 ml/g/min
Stress Nuclear Isotope Dose: 24 mCi
TID: 0.97

## 2022-12-31 MED ORDER — RUBIDIUM RB82 GENERATOR (RUBYFILL)
24.0000 | PACK | Freq: Once | INTRAVENOUS | Status: AC
  Administered 2022-12-31: 24 via INTRAVENOUS

## 2022-12-31 MED ORDER — REGADENOSON 0.4 MG/5ML IV SOLN
INTRAVENOUS | Status: AC
  Filled 2022-12-31: qty 5

## 2022-12-31 MED ORDER — REGADENOSON 0.4 MG/5ML IV SOLN
0.4000 mg | Freq: Once | INTRAVENOUS | Status: AC
  Administered 2022-12-31: 0.4 mg via INTRAVENOUS

## 2023-04-06 DIAGNOSIS — E291 Testicular hypofunction: Secondary | ICD-10-CM | POA: Diagnosis not present

## 2023-05-04 DIAGNOSIS — E291 Testicular hypofunction: Secondary | ICD-10-CM | POA: Diagnosis not present

## 2023-05-11 DIAGNOSIS — Z79890 Hormone replacement therapy (postmenopausal): Secondary | ICD-10-CM | POA: Diagnosis not present

## 2023-05-11 DIAGNOSIS — E291 Testicular hypofunction: Secondary | ICD-10-CM | POA: Diagnosis not present

## 2023-05-18 DIAGNOSIS — E291 Testicular hypofunction: Secondary | ICD-10-CM | POA: Diagnosis not present

## 2023-05-19 DIAGNOSIS — L814 Other melanin hyperpigmentation: Secondary | ICD-10-CM | POA: Diagnosis not present

## 2023-05-19 DIAGNOSIS — L821 Other seborrheic keratosis: Secondary | ICD-10-CM | POA: Diagnosis not present

## 2023-05-19 DIAGNOSIS — Z85828 Personal history of other malignant neoplasm of skin: Secondary | ICD-10-CM | POA: Diagnosis not present

## 2023-05-19 DIAGNOSIS — D225 Melanocytic nevi of trunk: Secondary | ICD-10-CM | POA: Diagnosis not present

## 2023-05-25 DIAGNOSIS — E291 Testicular hypofunction: Secondary | ICD-10-CM | POA: Diagnosis not present

## 2023-06-02 DIAGNOSIS — E291 Testicular hypofunction: Secondary | ICD-10-CM | POA: Diagnosis not present

## 2023-06-02 DIAGNOSIS — Z6826 Body mass index (BMI) 26.0-26.9, adult: Secondary | ICD-10-CM | POA: Diagnosis not present

## 2023-06-02 DIAGNOSIS — R6882 Decreased libido: Secondary | ICD-10-CM | POA: Diagnosis not present

## 2023-06-02 DIAGNOSIS — R5382 Chronic fatigue, unspecified: Secondary | ICD-10-CM | POA: Diagnosis not present

## 2023-06-09 DIAGNOSIS — E291 Testicular hypofunction: Secondary | ICD-10-CM | POA: Diagnosis not present

## 2023-06-22 DIAGNOSIS — E291 Testicular hypofunction: Secondary | ICD-10-CM | POA: Diagnosis not present

## 2023-06-29 DIAGNOSIS — E291 Testicular hypofunction: Secondary | ICD-10-CM | POA: Diagnosis not present

## 2023-07-02 DIAGNOSIS — E291 Testicular hypofunction: Secondary | ICD-10-CM | POA: Diagnosis not present

## 2023-07-14 DIAGNOSIS — E291 Testicular hypofunction: Secondary | ICD-10-CM | POA: Diagnosis not present

## 2023-07-16 DIAGNOSIS — H52203 Unspecified astigmatism, bilateral: Secondary | ICD-10-CM | POA: Diagnosis not present

## 2023-07-16 DIAGNOSIS — H40013 Open angle with borderline findings, low risk, bilateral: Secondary | ICD-10-CM | POA: Diagnosis not present

## 2023-07-21 DIAGNOSIS — E291 Testicular hypofunction: Secondary | ICD-10-CM | POA: Diagnosis not present

## 2023-07-28 DIAGNOSIS — E291 Testicular hypofunction: Secondary | ICD-10-CM | POA: Diagnosis not present

## 2023-08-04 DIAGNOSIS — E291 Testicular hypofunction: Secondary | ICD-10-CM | POA: Diagnosis not present

## 2023-08-11 DIAGNOSIS — E291 Testicular hypofunction: Secondary | ICD-10-CM | POA: Diagnosis not present

## 2023-08-11 DIAGNOSIS — Z6826 Body mass index (BMI) 26.0-26.9, adult: Secondary | ICD-10-CM | POA: Diagnosis not present

## 2023-08-18 DIAGNOSIS — E291 Testicular hypofunction: Secondary | ICD-10-CM | POA: Diagnosis not present

## 2023-08-25 DIAGNOSIS — E291 Testicular hypofunction: Secondary | ICD-10-CM | POA: Diagnosis not present

## 2023-09-01 DIAGNOSIS — E291 Testicular hypofunction: Secondary | ICD-10-CM | POA: Diagnosis not present

## 2023-09-22 DIAGNOSIS — E291 Testicular hypofunction: Secondary | ICD-10-CM | POA: Diagnosis not present

## 2023-09-28 DIAGNOSIS — E291 Testicular hypofunction: Secondary | ICD-10-CM | POA: Diagnosis not present

## 2023-10-01 DIAGNOSIS — M9903 Segmental and somatic dysfunction of lumbar region: Secondary | ICD-10-CM | POA: Diagnosis not present

## 2023-10-01 DIAGNOSIS — M4604 Spinal enthesopathy, thoracic region: Secondary | ICD-10-CM | POA: Diagnosis not present

## 2023-10-01 DIAGNOSIS — M9905 Segmental and somatic dysfunction of pelvic region: Secondary | ICD-10-CM | POA: Diagnosis not present

## 2023-10-01 DIAGNOSIS — M9902 Segmental and somatic dysfunction of thoracic region: Secondary | ICD-10-CM | POA: Diagnosis not present

## 2023-10-02 DIAGNOSIS — M4604 Spinal enthesopathy, thoracic region: Secondary | ICD-10-CM | POA: Diagnosis not present

## 2023-10-02 DIAGNOSIS — M9903 Segmental and somatic dysfunction of lumbar region: Secondary | ICD-10-CM | POA: Diagnosis not present

## 2023-10-02 DIAGNOSIS — M9905 Segmental and somatic dysfunction of pelvic region: Secondary | ICD-10-CM | POA: Diagnosis not present

## 2023-10-02 DIAGNOSIS — M9902 Segmental and somatic dysfunction of thoracic region: Secondary | ICD-10-CM | POA: Diagnosis not present

## 2023-10-05 DIAGNOSIS — E291 Testicular hypofunction: Secondary | ICD-10-CM | POA: Diagnosis not present

## 2023-10-05 DIAGNOSIS — Z79890 Hormone replacement therapy (postmenopausal): Secondary | ICD-10-CM | POA: Diagnosis not present

## 2023-10-09 ENCOUNTER — Ambulatory Visit: Payer: Self-pay | Admitting: Sports Medicine

## 2023-10-09 VITALS — BP 148/72 | HR 66 | Ht 72.0 in | Wt 210.0 lb

## 2023-10-09 DIAGNOSIS — Z0289 Encounter for other administrative examinations: Secondary | ICD-10-CM | POA: Insufficient documentation

## 2023-10-09 LAB — POCT URINALYSIS DIP (CLINITEK)
Bilirubin, UA: NEGATIVE
Blood, UA: NEGATIVE
Glucose, UA: NEGATIVE mg/dL
Ketones, POC UA: NEGATIVE mg/dL
Leukocytes, UA: NEGATIVE
Nitrite, UA: NEGATIVE
POC PROTEIN,UA: NEGATIVE
Spec Grav, UA: 1.02 (ref 1.010–1.025)
Urobilinogen, UA: 0.2 U/dL
pH, UA: 6 (ref 5.0–8.0)

## 2023-10-09 NOTE — Assessment & Plan Note (Signed)
 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 He will need another ECG sometime January 2026.

## 2023-10-09 NOTE — Progress Notes (Signed)
    Procedures performed today:    None.  Independent interpretation of notes and tests performed by another provider:   None.  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 He will need another ECG sometime January 2026.  Please see the FAA system for further details.  I spent 30 minutes of total time managing this patient today, this includes chart review, face to face, and non-face to face time.  ____________________________________________ Debby PARAS. Curtis, M.D., ABFM., CAQSM., AME. Primary Care and Sports Medicine [place] [facility]  Adjunct Professor of [facility] Medicine  [employer]  Restaurant [employer], [employer]

## 2024-06-07 ENCOUNTER — Encounter: Payer: Self-pay | Admitting: Sports Medicine
